# Patient Record
Sex: Male | Born: 2000 | Race: White | Hispanic: Yes | Marital: Single | Smoking: Never smoker
Health system: Southern US, Community
[De-identification: ages and names within clinical notes are randomized; demographics above are authoritative.]

## PROBLEM LIST (undated history)

## (undated) DIAGNOSIS — E669 Obesity, unspecified: Secondary | ICD-10-CM

## (undated) DIAGNOSIS — J301 Allergic rhinitis due to pollen: Secondary | ICD-10-CM

## (undated) DIAGNOSIS — J45909 Unspecified asthma, uncomplicated: Secondary | ICD-10-CM

## (undated) HISTORY — PX: APPENDECTOMY: SHX54

## (undated) HISTORY — PX: TONSILLECTOMY AND ADENOIDECTOMY: SHX28

## (undated) HISTORY — PX: HERNIA REPAIR: SHX51

---
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
## 2020-07-06 ENCOUNTER — Telehealth: Payer: Self-pay | Admitting: Emergency Medicine

## 2020-07-06 ENCOUNTER — Ambulatory Visit
Admission: EM | Admit: 2020-07-06 | Discharge: 2020-07-06 | Disposition: A | Discharge status: Home / Self Care | Payer: Medicaid Other | Attending: Internal Medicine | Admitting: Internal Medicine

## 2020-07-06 DIAGNOSIS — L0291 Cutaneous abscess, unspecified: Secondary | ICD-10-CM | POA: Diagnosis not present

## 2020-07-06 MED ORDER — DOXYCYCLINE MONOHYDRATE 100 MG PO TABS: 100.0000 mg | ORAL_TABLET | Freq: Two times a day (BID) | ORAL | 0 refills | Status: DC

## 2020-07-06 MED ORDER — CEPHALEXIN 500 MG PO CAPS: ORAL_CAPSULE | ORAL | 0 refills | Status: DC

## 2020-07-06 NOTE — Discharge Instructions (Addendum)
Use heat on the area for 15 minutes 2-3 times day for 3-4 days to help it resolve faster. Sometimes it makes the pimple or boil come to a head and puss may drain out. If so this will help it resolve sooner. Come back if this area gets larger or tender.   The doxy was cancelled since his insurance will not cover it and sent keflex instead.

## 2020-07-06 NOTE — Telephone Encounter (Signed)
Pharmacy called to state Doxycycline tablets are not covered under insurance but capsules are. Per [place], [place] script changed to capsules. Amy, CMA spoke with Lorin Picket at the pharmacy.

## 2020-07-06 NOTE — ED Provider Notes (Addendum)
[facility]    CSN: [id number] Arrival date & time: 07/06/20  1339      History   Chief Complaint Chief Complaint  Patient presents with  . Abscess    HPI Calvin Pacheco is a 19 y.o. male. who presents with swollen area on lower abdomen and states that he has had this area for a long time, but he squeezed it and it got larger. His mother looked at this and told him looked similar to what his sister had in the past and told to come here. This area is slightly tender. Denies drainage from this area. Has had an abscess in her R inner leg in the past, but has never been cultured or drained.    Past Medical History:  Diagnosis Date  . Allergic rhinitis due to pollen   . Asthma   . Obesity     There are no problems to display for this patient.   Past Surgical History:  Procedure Laterality Date  . APPENDECTOMY    . HERNIA REPAIR    . TONSILLECTOMY AND ADENOIDECTOMY         Home Medications    Prior to Admission medications   Medication Sig Start Date End Date Taking? Authorizing Provider  beclomethasone (QVAR) 40 MCG/ACT inhaler Inhale 1 puff into the lungs 2 (two) times daily.    [provider]  doxycycline (ADOXA) 100 MG tablet Take 1 tablet (100 mg total) by mouth 2 (two) times daily. 07/06/20   Rodriguez-Southworth, Nettie Elm, PA-C  fexofenadine (ALLEGRA) 180 MG tablet Take 180 mg by mouth daily.    [provider]  fluticasone (FLONASE) 50 MCG/ACT nasal spray Place 2 sprays into both nostrils daily. 12/06/18   Domenick Gong, MD    Family History Family History  Problem Relation Age of Onset  . Healthy Mother   . Heart disease Father     Social History Social History   Tobacco Use  . Smoking status: Never Smoker  . Smokeless tobacco: Never Used  Vaping Use  . Vaping Use: Never used  Substance Use Topics  . Alcohol use: No    Alcohol/week: 0.0 standard drinks  . Drug use: No     Allergies   Patient has no known  allergies.   Review of Systems Review of Systems  Constitutional: Negative for chills, diaphoresis and fever.  Gastrointestinal: Negative for abdominal distention, abdominal pain and nausea.  Skin:       Bump on lower abdomen  Neurological: Negative for numbness.     Physical Exam Triage Vital Signs ED Triage Vitals  Enc Vitals Group     BP 07/06/20 1350 (!) 130/101     Pulse Rate 07/06/20 1350 99     Resp 07/06/20 1350 18     Temp 07/06/20 1350 98.2 F (36.8 C)     Temp Source 07/06/20 1350 Oral     SpO2 07/06/20 1350 100 %     Weight 07/06/20 1351 (!) 330 lb (149.7 kg)     Height 07/06/20 1351 5\' 8"  (1.727 m)     Head Circumference --      Peak Flow --      Pain Score 07/06/20 1351 1     Pain Loc --      Pain Edu? --      Excl. in GC? --    No data found.  Updated Vital Signs BP (!) 130/101   Pulse 99   Temp 98.2 F (36.8 C) (  Oral)   Resp 18   Ht 5\' 8"  (1.727 m)   Wt (!) 330 lb (149.7 kg)   SpO2 100%   BMI 50.18 kg/m   Visual Acuity Right Eye Distance:   Left Eye Distance:   Bilateral Distance:    Right Eye Near:   Left Eye Near:    Bilateral Near:     Physical Exam Vitals and nursing note reviewed.  Constitutional:      General: He is not in acute distress.    Appearance: He is obese. He is not toxic-appearing.  HENT:     Right Ear: External ear normal.     Left Ear: External ear normal.     Nose: Nose normal.  Eyes:     General: No scleral icterus.    Conjunctiva/sclera: Conjunctivae normal.  Pulmonary:     Effort: Pulmonary effort is normal.  Musculoskeletal:        General: Normal range of motion.  Skin:    General: Skin is warm and dry.     Findings: Bruising present. No erythema.     Comments: 1 cm x 1 cm slightly raised bruised area which is a little fluctuant, but has induration under. Has squeezed it lightly and dark blood started draining, about 3 ml. But no puss. I placed a 2x2 gauze over it.   Neurological:     Mental Status:  He is alert and oriented to person, place, and time.  Psychiatric:        Mood and Affect: Mood normal.        Behavior: Behavior normal.        Thought Content: Thought content normal.        Judgment: Judgment normal.      UC Treatments / Results  Labs (all labs ordered are listed, but only abnormal results are displayed) Labs Reviewed - No data to display  EKG   Radiology No results found.  Procedures Procedures (including critical care time)  Medications Ordered in UC Medications - No data to display  Initial Impression / Assessment and Plan / UC Course  I have reviewed the triage vital signs and the nursing notes. I believe he may have early abscess with hematoma from squeezing it. I placed him on Doxy since he and sister have hx of abscess.  See instructions.  Final Clinical Impressions(s) / UC Diagnoses   Final diagnoses:  Abscess     Discharge Instructions     Use heat on the area for 15 minutes 2-3 times day for 3-4 days to help it resolve faster. Sometimes it makes the pimple or boil come to a head and puss may drain out. If so this will help it resolve sooner. Come back if this area gets larger or tender.     ED Prescriptions    Medication Sig Dispense Auth. Provider   doxycycline (ADOXA) 100 MG tablet Take 1 tablet (100 mg total) by mouth 2 (two) times daily. 20 tablet Rodriguez-Southworth, , PA-C     PDMP not reviewed this encounter.   Nettie Elm, PA-C 07/06/20 1424    Rodriguez-Southworth, 07/08/20, PA-C 07/06/20 1500

## 2020-07-06 NOTE — ED Triage Notes (Signed)
Pt reports having an abscess in groin area. Pt reports the area is red and swollen.

## 2020-11-07 ENCOUNTER — Ambulatory Visit
Admission: EM | Admit: 2020-11-07 | Discharge: 2020-11-07 | Disposition: A | Discharge status: Home / Self Care | Payer: Medicaid Other | Attending: Emergency Medicine | Admitting: Emergency Medicine

## 2020-11-07 ENCOUNTER — Other Ambulatory Visit: Payer: Self-pay

## 2020-11-07 DIAGNOSIS — J029 Acute pharyngitis, unspecified: Secondary | ICD-10-CM | POA: Insufficient documentation

## 2020-11-07 DIAGNOSIS — Z20822 Contact with and (suspected) exposure to covid-19: Secondary | ICD-10-CM | POA: Insufficient documentation

## 2020-11-07 LAB — GROUP A STREP BY PCR: Group A Strep by PCR: NOT DETECTED

## 2020-11-07 LAB — SARS CORONAVIRUS 2 (TAT 6-24 HRS): SARS Coronavirus 2: NEGATIVE

## 2020-11-07 MED ORDER — IBUPROFEN 600 MG PO TABS: 600.0000 mg | ORAL_TABLET | Freq: Four times a day (QID) | ORAL | 0 refills | Status: DC | PRN

## 2020-11-07 NOTE — Discharge Instructions (Addendum)
Your strep PCR test came back negative.  Covid test will be back in 24 hours.  We will contact you if it comes back positive and will arrange an appointment for monoclonal antibody infusion if you are a candidate for it.  I believe you are based on your history of asthma.   discontinue the Allegra, start Claritin-D or Zyrtec-D, and if this does not work, then discontinue the Claritin-D/ Zyrtec-D and start Mucinex D instead.  Continue Flonase, start saline nasal irrigation with a Lloyd Huger med rinse and distilled water as often as you want. 1 gram of Tylenol and 600 mg ibuprofen together 3-4 times a day as needed for pain.  Make sure you drink plenty of extra fluids.  Some people find salt water gargles and  Traditional Medicinal's "Throat Coat" tea helpful. Take 5 mL of liquid Benadryl and 5 mL of Maalox. Mix it together, and then hold it in your mouth for as long as you can and then swallow. You may do this 4 times a day.    Go to www.goodrx.com to look up your medications. This will give you a list of where you can find your prescriptions at the most affordable prices. Or ask the pharmacist what the cash price is, or if they have any other discount programs available to help make your medication more affordable. This can be less expensive than what you would pay with insurance.

## 2020-11-07 NOTE — ED Provider Notes (Signed)
HPI  SUBJECTIVE:  Patient reports sore throat starting 2 days ago. Sx worse with swallowing.  Sx better with nothing. Has been taking Allegra, Flonase w/ o relief.  No fever   No neck stiffness  No Cough + nasal congestion, greenish rhinorrhea, postnasal drip.  Positive maxillary sinus pain and pressure. No Myalgias No Headache No Rash  No loss of taste or smell No shortness of breath or difficulty breathing No nausea, vomiting No diarrhea No abdominal pain     No Recent Strep, mono, COVID exposure, however brother had similar symptoms last week.  It was not strep. No reflux sxs + Allergy sxs-reports itchy, watery eyes, sneezing.  Has a history of allergies and states that they are bothering him right now.  No Breathing difficulty, voice changes, sensation of throat swelling shut No Drooling No Trismus No abx in past month.  No antipyretic in past 4-6 hrs  Patient has a past medical history of allergies, allergic rhinitis, frequent strep status post tonsillectomy and adenoidectomy, asthma.  No history of diabetes, hypertension. ZJI:RCVELF, [facility]    Past Medical History:  Diagnosis Date  . Allergic rhinitis due to pollen   . Asthma   . Obesity     Past Surgical History:  Procedure Laterality Date  . APPENDECTOMY    . HERNIA REPAIR    . TONSILLECTOMY AND ADENOIDECTOMY      Family History  Problem Relation Age of Onset  . Healthy Mother   . Heart disease Father     Social History   Tobacco Use  . Smoking status: Never Smoker  . Smokeless tobacco: Never Used  Vaping Use  . Vaping Use: Never used  Substance Use Topics  . Alcohol use: No    Alcohol/week: 0.0 standard drinks  . Drug use: No    No current facility-administered medications for this encounter.  Current Outpatient Medications:  .  beclomethasone (QVAR) 40 MCG/ACT inhaler, Inhale 1 puff into the lungs 2 (two) times daily., Disp: , Rfl:  .  fluticasone (FLONASE) 50 MCG/ACT nasal  spray, Place 2 sprays into both nostrils daily., Disp: 16 g, Rfl: 0 .  ibuprofen (ADVIL) 600 MG tablet, Take 1 tablet (600 mg total) by mouth every 6 (six) hours as needed., Disp: 30 tablet, Rfl: 0  No Known Allergies   ROS  As noted in HPI.   Physical Exam  BP 119/69 (BP Location: Left Arm)   Pulse 84   Temp 98.2 F (36.8 C) (Oral)   Resp 18   Ht 5\' 8"  (1.727 m)   Wt (!) 154.2 kg   SpO2 98%   BMI 51.70 kg/m   Constitutional: Well developed, well nourished, no acute distress Eyes:  EOMI, conjunctiva normal bilaterally HENT: Normocephalic, atraumatic,mucus membranes moist. + nasal congestion, erythematous, swollen turbinates.  No maxillary, frontal sinus tenderness.  Positive erythematous oropharynx.  Tonsils surgically absent.  Uvula midline.  Positive cobblestoning and postnasal drip.    Respiratory: Normal inspiratory effort Cardiovascular: Normal rate, no murmurs, rubs, gallops GI: nondistended, nontender. No appreciable splenomegaly skin: No rash, skin intact Lymph: -  Anterior cervical LN.  No posterior cervical lymphadenopathy Musculoskeletal: no deformities Neurologic: Alert & oriented x 3, no focal neuro deficits Psychiatric: Speech and behavior appropriate.   ED Course   Medications - No data to display  Orders Placed This Encounter  Procedures  . Group A Strep by PCR    Standing Status:   Standing    Number of Occurrences:  1  . SARS CORONAVIRUS 2 (TAT 6-24 HRS) Nasopharyngeal Nasopharyngeal Swab    Standing Status:   Standing    Number of Occurrences:   1    Order Specific Question:   Is this test for diagnosis or screening    Answer:   Diagnosis of ill patient    Order Specific Question:   Symptomatic for COVID-19 as defined by CDC    Answer:   Yes    Order Specific Question:   Date of Symptom Onset    Answer:   11/05/2020    Order Specific Question:   Hospitalized for COVID-19    Answer:   No    Order Specific Question:   Admitted to ICU for  COVID-19    Answer:   No    Order Specific Question:   Previously tested for COVID-19    Answer:   Yes    Order Specific Question:   Resident in a congregate (group) care setting    Answer:   No    Order Specific Question:   Employed in healthcare setting    Answer:   No    Order Specific Question:   Has patient completed COVID vaccination(s) (2 doses of Pfizer/Moderna 1 dose of [employer])    Answer:   Yes    Results for orders placed or performed during the hospital encounter of 11/07/20 (from the past 24 hour(s))  Group A Strep by PCR     Status: None   Collection Time: 11/07/20  9:12 AM   Specimen: Throat; Sterile Swab  Result Value Ref Range   Group A Strep by PCR NOT DETECTED NOT DETECTED   No results found.  ED Clinical Impression  1. Pharyngitis, unspecified etiology   2. Encounter for laboratory testing for COVID-19 virus      ED Assessment/Plan  Covid PCR sent.  If positive, he will be a candidate for monoclonal antibody infusion based on BMI and asthma.  Difficult to tell if this is infectious or allergies.  His brother has similar symptoms, but it is not strep.    Strep PCR negative.  He states that his allergies are bothering him.  We will have him discontinue the Allegra, start Claritin-D or Zyrtec-D, and if this does not work, then he will discontinue the Claritin-D or Zyrtec-D and start Mucinex D instead.  Continue Flonase, start saline nasal irrigation with a Lloyd Huger med rinse and distilled water as often as he wants.  Benadryl/Maalox mixture, Tylenol/ibuprofen combined.  Follow-up with PMD as needed.  To the ER if he gets worse.  Discussed labs,  MDM, plan and followup with parent and patient.  Discussed sn/sx that should prompt return to the ED. parent and patient agrees with plan.   Meds ordered this encounter  Medications  . ibuprofen (ADVIL) 600 MG tablet    Sig: Take 1 tablet (600 mg total) by mouth every 6 (six) hours as needed.    Dispense:  30  tablet    Refill:  0     *This clinic note was created using Scientist, clinical (histocompatibility and immunogenetics). Therefore, there may be occasional mistakes despite careful proofreading.     Domenick Gong, MD 11/07/20 1024

## 2020-11-07 NOTE — ED Triage Notes (Signed)
Patient complains of sore throat, runny nose and cough x Sunday.

## 2021-01-10 ENCOUNTER — Ambulatory Visit
Admission: EM | Admit: 2021-01-10 | Discharge: 2021-01-10 | Disposition: A | Discharge status: Home / Self Care | Payer: Medicaid Other | Attending: Sports Medicine | Admitting: Sports Medicine

## 2021-01-10 ENCOUNTER — Other Ambulatory Visit: Payer: Self-pay

## 2021-01-10 DIAGNOSIS — J069 Acute upper respiratory infection, unspecified: Secondary | ICD-10-CM

## 2021-01-10 DIAGNOSIS — M791 Myalgia, unspecified site: Secondary | ICD-10-CM | POA: Diagnosis not present

## 2021-01-10 DIAGNOSIS — U071 COVID-19: Secondary | ICD-10-CM | POA: Diagnosis not present

## 2021-01-10 DIAGNOSIS — J3489 Other specified disorders of nose and nasal sinuses: Secondary | ICD-10-CM | POA: Diagnosis not present

## 2021-01-10 DIAGNOSIS — Z79899 Other long term (current) drug therapy: Secondary | ICD-10-CM | POA: Diagnosis not present

## 2021-01-10 DIAGNOSIS — J029 Acute pharyngitis, unspecified: Secondary | ICD-10-CM | POA: Diagnosis not present

## 2021-01-10 DIAGNOSIS — Z7951 Long term (current) use of inhaled steroids: Secondary | ICD-10-CM | POA: Diagnosis not present

## 2021-01-10 DIAGNOSIS — R059 Cough, unspecified: Secondary | ICD-10-CM | POA: Insufficient documentation

## 2021-01-10 LAB — SARS CORONAVIRUS 2 (TAT 6-24 HRS): SARS Coronavirus 2: POSITIVE — AB

## 2021-01-10 NOTE — ED Triage Notes (Signed)
Patient complains of cough, back pain, headaches, nasal congestion and runny nose x Monday. States that he has not had any covid testing.

## 2021-01-10 NOTE — Discharge Instructions (Signed)
We will go ahead and tested for COVID.  Indicated that it would take between 6 and 24 hours for the test result to come back.  He voiced verbal understanding. We will give him a work note indicating he is in isolation until the results of the COVID test come back.  If he is negative then he can return to work with a mask.  If he is positive then his isolation will switch to quarantine per the new CDC guidelines.  Somebody from the office will contact him for a positive test only. Supportive care, over-the-counter meds as needed, Tylenol or Motrin for fever or discomfort.  Symptomatic control including cough medicine, Mucinex, and salt water gargles. We will give him an educational handout. Follow-up here as needed.

## 2021-01-10 NOTE — ED Provider Notes (Signed)
[facility]    CSN: [id number] Arrival date & time: 01/10/21  0806      History   Chief Complaint Chief Complaint  Patient presents with  . Cough    HPI Calvin Pacheco is a 20 y.o. male.   Pleasant 20 year old male who presents for evaluation the above issues.  He reports 2 days of cough rhinorrhea and a mild sore throat.  The cough is nonproductive.  He denies any fever shakes chills, nausea vomiting or diarrhea.  No abdominal pain or urinary symptoms.  Denies chest pain or shortness of breath.  He reports no COVID exposure and no history of COVID.  Has been vaccinated x2 but no booster.  No flu vaccine.  He also reports some mild myalgias with some low back issues.  No accidents trauma or falls with his back pain.  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  No red flag signs or symptoms offered by the patient during the history.     Past Medical History:  Diagnosis Date  . Allergic rhinitis due to pollen   . Asthma   . Obesity     There are no problems to display for this patient.   Past Surgical History:  Procedure Laterality Date  . APPENDECTOMY    . HERNIA REPAIR    . TONSILLECTOMY AND ADENOIDECTOMY         Home Medications    Prior to Admission medications   Medication Sig Start Date End Date Taking? Authorizing Provider  albuterol (VENTOLIN HFA) 108 (90 Base) MCG/ACT inhaler Inhale into the lungs. 03/07/20 03/07/21 Yes [provider]  beclomethasone (QVAR) 40 MCG/ACT inhaler Inhale 1 puff into the lungs 2 (two) times daily.   Yes [provider]  fluticasone (FLONASE) 50 MCG/ACT nasal spray Place 2 sprays into both nostrils daily. 12/06/18  Yes Domenick Gong, MD  ibuprofen (ADVIL) 600 MG tablet Take 1 tablet (600 mg total) by mouth every 6 (six) hours as needed. 11/07/20  Yes Domenick Gong, MD  SODIUM FLUORIDE 5000 PPM 1.1 % PSTE Take by mouth. 10/12/20   [provider]  fexofenadine (ALLEGRA) 180 MG  tablet Take 180 mg by mouth daily.  11/07/20  [provider]    Family History Family History  Problem Relation Age of Onset  . Healthy Mother   . Heart disease Father     Social History Social History   Tobacco Use  . Smoking status: Never Smoker  . Smokeless tobacco: Never Used  Vaping Use  . Vaping Use: Never used  Substance Use Topics  . Alcohol use: No    Alcohol/week: 0.0 standard drinks  . Drug use: No     Allergies   Patient has no known allergies.   Review of Systems Review of Systems  Constitutional: Negative for chills, diaphoresis, fatigue and fever.  HENT: Positive for congestion, rhinorrhea and sore throat. Negative for ear pain, postnasal drip, sinus pressure, sinus pain and sneezing.   Eyes: Negative for pain.  Respiratory: Negative for cough, choking, chest tightness, shortness of breath, wheezing and stridor.   Cardiovascular: Negative for chest pain and palpitations.  Gastrointestinal: Negative for abdominal pain.  Genitourinary: Negative for dysuria.  Musculoskeletal: Positive for back pain and myalgias. Negative for neck pain and neck stiffness.  Skin: Negative for color change, pallor, rash and wound.  Neurological: Negative for dizziness, syncope, light-headedness, numbness and headaches.  All other systems reviewed and are negative.    Physical Exam  Triage Vital Signs ED Triage Vitals  Enc Vitals Group     BP 01/10/21 0827 118/77     Pulse Rate 01/10/21 0827 98     Resp 01/10/21 0827 18     Temp 01/10/21 0827 98.6 F (37 C)     Temp Source 01/10/21 0827 Oral     SpO2 01/10/21 0827 99 %     Weight 01/10/21 0825 (!) 339 lb 15.2 oz (154.2 kg)     Height 01/10/21 0825 5\' 8"  (1.727 m)     Head Circumference --      Peak Flow --      Pain Score 01/10/21 0825 2     Pain Loc --      Pain Edu? --      Excl. in GC? --    No data found.  Updated Vital Signs BP 118/77 (BP Location: Left Arm)   Pulse 98   Temp 98.6 F (37 C)  (Oral)   Resp 18   Ht 5\' 8"  (1.727 m)   Wt (!) 154.2 kg   SpO2 99%   BMI 51.69 kg/m   Visual Acuity Right Eye Distance:   Left Eye Distance:   Bilateral Distance:    Right Eye Near:   Left Eye Near:    Bilateral Near:     Physical Exam Vitals and nursing note reviewed.  Constitutional:      General: He is not in acute distress.    Appearance: Normal appearance. He is not ill-appearing, toxic-appearing or diaphoretic.  HENT:     Head: Normocephalic and atraumatic.     Right Ear: Tympanic membrane normal.     Left Ear: Tympanic membrane normal.     Nose: Congestion and rhinorrhea present.     Mouth/Throat:     Mouth: Mucous membranes are moist.     Pharynx: Posterior oropharyngeal erythema present. No oropharyngeal exudate.  Eyes:     Extraocular Movements: Extraocular movements intact.     Conjunctiva/sclera: Conjunctivae normal.     Pupils: Pupils are equal, round, and reactive to light.  Cardiovascular:     Rate and Rhythm: Normal rate and regular rhythm.     Pulses: Normal pulses.     Heart sounds: Normal heart sounds. No murmur heard. No friction rub. No gallop.   Pulmonary:     Effort: Pulmonary effort is normal. No respiratory distress.     Breath sounds: Normal breath sounds. No stridor. No wheezing, rhonchi or rales.  Musculoskeletal:     Cervical back: Normal range of motion and neck supple. No rigidity.  Lymphadenopathy:     Cervical: Cervical adenopathy present.  Skin:    General: Skin is warm and dry.     Capillary Refill: Capillary refill takes less than 2 seconds.  Neurological:     General: No focal deficit present.     Mental Status: He is alert and oriented to person, place, and time. Mental status is at baseline.  Psychiatric:        Mood and Affect: Mood normal.        Behavior: Behavior normal.      UC Treatments / Results  Labs (all labs ordered are listed, but only abnormal results are displayed) Labs Reviewed  SARS CORONAVIRUS 2  (TAT 6-24 HRS)    EKG   Radiology No results found.  Procedures Procedures (including critical care time)  Medications Ordered in UC Medications - No data to display  Initial Impression / Assessment and Plan /  UC Course  I have reviewed the triage vital signs and the nursing notes.  Pertinent labs & imaging results that were available during my care of the patient were reviewed by me and considered in my medical decision making (see chart for details).   Clinical impression: 20 year old with COVID-like symptoms including cough rhinorrhea and sore throat with some myalgias for 2 days.  Treatment plan: 1.  The findings and treatment plan were discussed in detail with the patient.  The patient was in agreement. 2.  We will go ahead and tested for COVID.  Indicated that it would take between 6 and 24 hours for the test result to come back.  He voiced verbal understanding. 3.  We will give him a work note indicating he is in isolation until the results of the COVID test come back.  If he is negative then he can return to work with a mask.  If he is positive then his isolation will switch to quarantine per the new CDC guidelines.  Somebody from the office will contact him for a positive test only. 4.  Supportive care, over-the-counter meds as needed, Tylenol or Motrin for fever or discomfort.  Symptomatic control including cough medicine, Mucinex, and salt water gargles. 5.  We will give him an educational handout. 6.  Follow-up here as needed.    Final Clinical Impressions(s) / UC Diagnoses   Final diagnoses:  Cough  Upper respiratory tract infection, unspecified type  Myalgia     Discharge Instructions     We will go ahead and tested for COVID.  Indicated that it would take between 6 and 24 hours for the test result to come back.  He voiced verbal understanding. We will give him a work note indicating he is in isolation until the results of the COVID test come back.  If he is  negative then he can return to work with a mask.  If he is positive then his isolation will switch to quarantine per the new CDC guidelines.  Somebody from the office will contact him for a positive test only. Supportive care, over-the-counter meds as needed, Tylenol or Motrin for fever or discomfort.  Symptomatic control including cough medicine, Mucinex, and salt water gargles. We will give him an educational handout. Follow-up here as needed.    ED Prescriptions    None     PDMP not reviewed this encounter.   Delton See, MD 01/10/21 [phone]

## 2021-01-14 ENCOUNTER — Ambulatory Visit
Admission: EM | Admit: 2021-01-14 | Discharge: 2021-01-14 | Disposition: A | Discharge status: Home / Self Care | Payer: Medicaid Other | Attending: Physician Assistant | Admitting: Physician Assistant

## 2021-01-14 ENCOUNTER — Other Ambulatory Visit: Payer: Self-pay

## 2021-01-14 ENCOUNTER — Encounter: Payer: Self-pay | Admitting: Emergency Medicine

## 2021-01-14 DIAGNOSIS — U071 COVID-19: Secondary | ICD-10-CM | POA: Diagnosis not present

## 2021-01-14 DIAGNOSIS — R059 Cough, unspecified: Secondary | ICD-10-CM

## 2021-01-14 DIAGNOSIS — B309 Viral conjunctivitis, unspecified: Secondary | ICD-10-CM | POA: Diagnosis not present

## 2021-01-14 MED ORDER — NAPHAZOLINE-POLYETHYL GLYCOL 0.012-0.2 % OP SOLN: 2.0000 [drp] | Freq: Four times a day (QID) | OPHTHALMIC | 0 refills | Status: AC | PRN

## 2021-01-14 MED ORDER — IBUPROFEN 800 MG PO TABS: 800.0000 mg | ORAL_TABLET | Freq: Three times a day (TID) | ORAL | 0 refills | Status: AC | PRN

## 2021-01-14 MED ORDER — BENZONATATE 100 MG PO CAPS: 200.0000 mg | ORAL_CAPSULE | Freq: Three times a day (TID) | ORAL | 0 refills | Status: AC | PRN

## 2021-01-14 NOTE — Discharge Instructions (Addendum)
You have a viral conjunctivitis due to COVID-19.  I have sent in some eyedrops to help.  If you have thick yellowish discharge he should follow-up with you should get better over the next few days.  I have also sent benzonatate for cough and Advil for pain.  Increase rest and fluids.  Continue to use your asthma inhalers.  You should be seen again if you develop a fever or breathing difficulty.  COVID-19 INFECTION: The incubation period of COVID-19 is approximately 14 days after exposure, with most symptoms developing in roughly 4-5 days. Symptoms may range in severity from mild to critically severe. Roughly 80% of those infected will have mild symptoms. People of any age may become infected with COVID-19 and have the ability to transmit the virus. The most common symptoms include: fever, fatigue, cough, body aches, headaches, sore throat, nasal congestion, shortness of breath, nausea, vomiting, diarrhea, changes in smell and/or taste.    COURSE OF ILLNESS Some patients may begin with mild disease which can progress quickly into critical symptoms. If your symptoms are worsening please call ahead to the Emergency Department and proceed there for further treatment. Recovery time appears to be roughly 1-2 weeks for mild symptoms and 3-6 weeks for severe disease.   GO IMMEDIATELY TO ER FOR FEVER YOU ARE UNABLE TO GET DOWN WITH TYLENOL, BREATHING PROBLEMS, CHEST PAIN, FATIGUE, LETHARGY, INABILITY TO EAT OR DRINK, ETC  QUARANTINE AND ISOLATION: To help decrease the spread of COVID-19 please remain isolated if you have COVID infection or are highly suspected to have COVID infection. This means -stay home and isolate to one room in the home if you live with others. Do not share a bed or bathroom with others while ill, sanitize and wipe down all countertops and keep common areas clean and disinfected. Stay home for 5 days. If you have no symptoms or your symptoms are resolving after 5 days, you can leave your  house. Continue to wear a mask around others for 5 additional days. If you have been in close contact (within 6 feet) of someone diagnosed with COVID 19, you are advised to quarantine in your home for 14 days as symptoms can develop anywhere from 2-14 days after exposure to the virus. If you develop symptoms, you  must isolate.  Most current guidelines for COVID after exposure -unvaccinated: isolate 5 days and strict mask use x 5 days. Test on day 5 is possible -vaccinated: wear mask x 10 days if symptoms do not develop -You do not necessarily need to be tested for COVID if you have + exposure and  develop symptoms. Just isolate at home x10 days from symptom onset During this global pandemic, CDC advises to practice social distancing, try to stay at least 15ft away from others at all times. Wear a face covering. Wash and sanitize your hands regularly and avoid going anywhere that is not necessary.  KEEP IN MIND THAT THE COVID TEST IS NOT 100% ACCURATE AND YOU SHOULD STILL DO EVERYTHING TO PREVENT POTENTIAL SPREAD OF VIRUS TO OTHERS (WEAR MASK, WEAR GLOVES, WASH HANDS AND SANITIZE REGULARLY). IF INITIAL TEST IS NEGATIVE, THIS MAY NOT MEAN YOU ARE DEFINITELY NEGATIVE. MOST ACCURATE TESTING IS DONE 5-7 DAYS AFTER EXPOSURE.   It is not advised by CDC to get re-tested after receiving a positive COVID test since you can still test positive for weeks to months after you have already cleared the virus.   *If you have not been vaccinated for COVID, I strongly  suggest you consider getting vaccinated as long as there are no contraindications.

## 2021-01-14 NOTE — ED Provider Notes (Signed)
[facility]    CSN: [id number] Arrival date & time: 01/14/21  0801      History   Chief Complaint Chief Complaint  Patient presents with  . Covid Positive  . Eye Pain    HPI Calvin Pacheco is a 20 y.o. male with recent diagnosis of COVID 7.  Patient has been ill for 6 days with cough, congestion, headaches.  Patient states over the past couple days his eyes have become red, itchy and draining clear fluid.  Denies any visual changes.  Patient denies any fever, weakness, breathing difficulty.  He says that his chest is hurt when he coughs.  He does have a history of asthma and has been using his inhalers as directed.  Patient has not been taking any over-the-counter medications for his symptoms.  He has multiple family members who are also COVID-positive.  Other than asthma and allergies, patient denies any other chronic medical problems.  He has no other complaints or concerns.  HPI  Past Medical History:  Diagnosis Date  . Allergic rhinitis due to pollen   . Asthma   . Obesity     There are no problems to display for this patient.   Past Surgical History:  Procedure Laterality Date  . APPENDECTOMY    . HERNIA REPAIR    . TONSILLECTOMY AND ADENOIDECTOMY         Home Medications    Prior to Admission medications   Medication Sig Start Date End Date Taking? Authorizing Provider  albuterol (VENTOLIN HFA) 108 (90 Base) MCG/ACT inhaler Inhale into the lungs. 03/07/20 03/07/21 Yes [provider]  beclomethasone (QVAR) 40 MCG/ACT inhaler Inhale 1 puff into the lungs 2 (two) times daily.   Yes [provider]  benzonatate (TESSALON) 100 MG capsule Take 2 capsules (200 mg total) by mouth 3 (three) times daily as needed for up to 7 days for cough. 01/14/21 01/21/21 Yes Eusebio Friendly B, PA-C  fluticasone (FLONASE) 50 MCG/ACT nasal spray Place 2 sprays into both nostrils daily. 12/06/18  Yes Domenick Gong, MD  ibuprofen (ADVIL) 800 MG tablet  Take 1 tablet (800 mg total) by mouth every 8 (eight) hours as needed for up to 7 days for moderate pain. 01/14/21 01/21/21 Yes Shirlee Latch, PA-C  Naphazoline-Polyethyl Glycol 0.012-0.2 % SOLN Apply 2 drops to eye every 6 (six) hours as needed for up to 7 days (eye pain, redness, itching). 01/14/21 01/21/21 Yes Shirlee Latch, PA-C  SODIUM FLUORIDE 5000 PPM 1.1 % PSTE Take by mouth. 10/12/20   [provider]  fexofenadine (ALLEGRA) 180 MG tablet Take 180 mg by mouth daily.  11/07/20  [provider]    Family History Family History  Problem Relation Age of Onset  . Healthy Mother   . Heart disease Father     Social History Social History   Tobacco Use  . Smoking status: Never Smoker  . Smokeless tobacco: Never Used  Vaping Use  . Vaping Use: Never used  Substance Use Topics  . Alcohol use: No    Alcohol/week: 0.0 standard drinks  . Drug use: No     Allergies   Patient has no known allergies.   Review of Systems Review of Systems  Constitutional: Positive for fatigue. Negative for fever.  HENT: Positive for congestion and rhinorrhea. Negative for sinus pressure, sinus pain and sore throat.   Eyes: Positive for pain, discharge, redness and itching.  Respiratory: Positive for cough. Negative for shortness of breath  and wheezing.   Cardiovascular: Positive for chest pain (only with deep coughing).  Gastrointestinal: Negative for abdominal pain, diarrhea, nausea and vomiting.  Musculoskeletal: Negative for myalgias.  Neurological: Positive for headaches. Negative for weakness and light-headedness.  Hematological: Negative for adenopathy.     Physical Exam Triage Vital Signs ED Triage Vitals  Enc Vitals Group     BP      Pulse      Resp      Temp      Temp src      SpO2      Weight      Height      Head Circumference      Peak Flow      Pain Score      Pain Loc      Pain Edu?      Excl. in GC?    No data found.  Updated Vital Signs BP  126/80 (BP Location: Left Arm)   Pulse 86   Temp 98.6 F (37 C) (Oral)   Resp 18   Wt (!) 321 lb 6.4 oz (145.8 kg)   SpO2 100%   BMI 48.87 kg/m       Physical Exam Vitals and nursing note reviewed.  Constitutional:      General: He is not in acute distress.    Appearance: Normal appearance. He is well-developed and well-nourished. He is obese. He is not ill-appearing or toxic-appearing.  HENT:     Head: Normocephalic and atraumatic.     Nose: Congestion and rhinorrhea present.     Mouth/Throat:     Mouth: Mucous membranes are moist.     Pharynx: Oropharynx is clear. No posterior oropharyngeal erythema.  Eyes:     General: No scleral icterus.       Right eye: No discharge.        Left eye: No discharge.     Extraocular Movements: Extraocular movements intact.     Conjunctiva/sclera:     Right eye: Right conjunctiva is injected (mild).     Left eye: Left conjunctiva is injected (mild).     Pupils: Pupils are equal, round, and reactive to light.  Cardiovascular:     Rate and Rhythm: Normal rate and regular rhythm.     Heart sounds: Normal heart sounds.  Pulmonary:     Effort: Pulmonary effort is normal. No respiratory distress.     Breath sounds: Normal breath sounds. No wheezing, rhonchi or rales.  Musculoskeletal:        General: No edema.     Cervical back: Neck supple.  Skin:    General: Skin is warm and dry.  Neurological:     General: No focal deficit present.     Mental Status: He is alert. Mental status is at baseline.     Motor: No weakness.     Gait: Gait normal.  Psychiatric:        Mood and Affect: Mood and affect and mood normal.        Behavior: Behavior normal.        Thought Content: Thought content normal.      UC Treatments / Results  Labs (all labs ordered are listed, but only abnormal results are displayed) Labs Reviewed - No data to display  EKG   Radiology No results found.  Procedures Procedures (including critical care  time)  Medications Ordered in UC Medications - No data to display  Initial Impression / Assessment and Plan / UC  Course  I have reviewed the triage vital signs and the nursing notes.  Pertinent labs & imaging results that were available during my care of the patient were reviewed by me and considered in my medical decision making (see chart for details).   1. COVID 19: Advised patient that his symptoms are due to COVID-19 and he should stay home until he is feeling better.  Discussed current CDC guidelines, isolation protocol and ED precautions.  2. Viral conjunctivitis of both eyes: Advised patient that the redness of his eyes is due to viral conjunctivitis secondary to COVID-19.  Sent in naphazoline eyedrops.  Encouraged him to rest and increase fluid intake.  Advised him to follow-up if he has any increased eye pain, visual disturbance, or thick yellowish discharge from his eyes.  3. Cough: Advised supportive care.  Patient has not been taking any over-the-counter cough medications.  Sent in benzonatate since that is the only cough medications covered by Medicaid.  Advised that if that is not helping he should consider OTC Robitussin.  Advised him to follow-up for worsening cough, increased pain in the chest, fever or breathing difficulty.  Advised that his symptoms can be ongoing for a couple of weeks but should be improving.  Final Clinical Impressions(s) / UC Diagnoses   Final diagnoses:  COVID-19  Viral conjunctivitis of both eyes  Cough     Discharge Instructions     You have a viral conjunctivitis due to COVID-19.  I have sent in some eyedrops to help.  If you have thick yellowish discharge he should follow-up with you should get better over the next few days.  I have also sent benzonatate for cough and Advil for pain.  Increase rest and fluids.  Continue to use your asthma inhalers.  You should be seen again if you develop a fever or breathing difficulty.  COVID-19 INFECTION:  The incubation period of COVID-19 is approximately 14 days after exposure, with most symptoms developing in roughly 4-5 days. Symptoms may range in severity from mild to critically severe. Roughly 80% of those infected will have mild symptoms. People of any age may become infected with COVID-19 and have the ability to transmit the virus. The most common symptoms include: fever, fatigue, cough, body aches, headaches, sore throat, nasal congestion, shortness of breath, nausea, vomiting, diarrhea, changes in smell and/or taste.    COURSE OF ILLNESS Some patients may begin with mild disease which can progress quickly into critical symptoms. If your symptoms are worsening please call ahead to the Emergency Department and proceed there for further treatment. Recovery time appears to be roughly 1-2 weeks for mild symptoms and 3-6 weeks for severe disease.   GO IMMEDIATELY TO ER FOR FEVER YOU ARE UNABLE TO GET DOWN WITH TYLENOL, BREATHING PROBLEMS, CHEST PAIN, FATIGUE, LETHARGY, INABILITY TO EAT OR DRINK, ETC  QUARANTINE AND ISOLATION: To help decrease the spread of COVID-19 please remain isolated if you have COVID infection or are highly suspected to have COVID infection. This means -stay home and isolate to one room in the home if you live with others. Do not share a bed or bathroom with others while ill, sanitize and wipe down all countertops and keep common areas clean and disinfected. Stay home for 5 days. If you have no symptoms or your symptoms are resolving after 5 days, you can leave your house. Continue to wear a mask around others for 5 additional days. If you have been in close contact (within 6 feet) of someone  diagnosed with COVID 19, you are advised to quarantine in your home for 14 days as symptoms can develop anywhere from 2-14 days after exposure to the virus. If you develop symptoms, you  must isolate.  Most current guidelines for COVID after exposure -unvaccinated: isolate 5 days and strict  mask use x 5 days. Test on day 5 is possible -vaccinated: wear mask x 10 days if symptoms do not develop -You do not necessarily need to be tested for COVID if you have + exposure and  develop symptoms. Just isolate at home x10 days from symptom onset During this global pandemic, CDC advises to practice social distancing, try to stay at least 90ft away from others at all times. Wear a face covering. Wash and sanitize your hands regularly and avoid going anywhere that is not necessary.  KEEP IN MIND THAT THE COVID TEST IS NOT 100% ACCURATE AND YOU SHOULD STILL DO EVERYTHING TO PREVENT POTENTIAL SPREAD OF VIRUS TO OTHERS (WEAR MASK, WEAR GLOVES, WASH HANDS AND SANITIZE REGULARLY). IF INITIAL TEST IS NEGATIVE, THIS MAY NOT MEAN YOU ARE DEFINITELY NEGATIVE. MOST ACCURATE TESTING IS DONE 5-7 DAYS AFTER EXPOSURE.   It is not advised by CDC to get re-tested after receiving a positive COVID test since you can still test positive for weeks to months after you have already cleared the virus.   *If you have not been vaccinated for COVID, I strongly suggest you consider getting vaccinated as long as there are no contraindications.      ED Prescriptions    Medication Sig Dispense Auth. Provider   benzonatate (TESSALON) 100 MG capsule Take 2 capsules (200 mg total) by mouth 3 (three) times daily as needed for up to 7 days for cough. 21 capsule Eusebio Friendly B, PA-C   Naphazoline-Polyethyl Glycol 0.012-0.2 % SOLN Apply 2 drops to eye every 6 (six) hours as needed for up to 7 days (eye pain, redness, itching). 15 mL Eusebio Friendly B, PA-C   ibuprofen (ADVIL) 800 MG tablet Take 1 tablet (800 mg total) by mouth every 8 (eight) hours as needed for up to 7 days for moderate pain. 21 tablet Shirlee Latch, PA-C     PDMP not reviewed this encounter.   Shirlee Latch, PA-C 01/14/21 [phone]

## 2021-01-14 NOTE — ED Triage Notes (Signed)
Pt tested positive  4 days ago for Covid. He is c/o eye redness and itching. Started yesterday. He states his eye is causing a headache. He also states his chest hurt when he coughs. Pt has h/o asthma.

## 2021-01-18 ENCOUNTER — Other Ambulatory Visit: Payer: Self-pay

## 2021-01-18 ENCOUNTER — Ambulatory Visit
Admission: EM | Admit: 2021-01-18 | Discharge: 2021-01-18 | Discharge status: Against Medical Advice | Payer: Medicaid Other

## 2021-05-02 ENCOUNTER — Other Ambulatory Visit: Payer: Self-pay

## 2021-05-02 ENCOUNTER — Ambulatory Visit
Admission: EM | Admit: 2021-05-02 | Discharge: 2021-05-02 | Disposition: A | Discharge status: Home / Self Care | Payer: Medicaid Other | Attending: Sports Medicine | Admitting: Sports Medicine

## 2021-05-02 ENCOUNTER — Encounter: Payer: Self-pay | Admitting: Emergency Medicine

## 2021-05-02 DIAGNOSIS — J4520 Mild intermittent asthma, uncomplicated: Secondary | ICD-10-CM | POA: Insufficient documentation

## 2021-05-02 DIAGNOSIS — Z8616 Personal history of COVID-19: Secondary | ICD-10-CM | POA: Insufficient documentation

## 2021-05-02 DIAGNOSIS — J069 Acute upper respiratory infection, unspecified: Secondary | ICD-10-CM | POA: Diagnosis not present

## 2021-05-02 DIAGNOSIS — R059 Cough, unspecified: Secondary | ICD-10-CM

## 2021-05-02 DIAGNOSIS — Z7951 Long term (current) use of inhaled steroids: Secondary | ICD-10-CM | POA: Insufficient documentation

## 2021-05-02 DIAGNOSIS — Z20822 Contact with and (suspected) exposure to covid-19: Secondary | ICD-10-CM | POA: Insufficient documentation

## 2021-05-02 DIAGNOSIS — R062 Wheezing: Secondary | ICD-10-CM | POA: Diagnosis not present

## 2021-05-02 MED ORDER — PROMETHAZINE-DM 6.25-15 MG/5ML PO SYRP: 5.0000 mL | ORAL_SOLUTION | Freq: Four times a day (QID) | ORAL | 0 refills | Status: DC | PRN

## 2021-05-02 MED ORDER — BENZONATATE 100 MG PO CAPS: 100.0000 mg | ORAL_CAPSULE | Freq: Three times a day (TID) | ORAL | 0 refills | Status: DC

## 2021-05-02 MED ORDER — ALBUTEROL SULFATE HFA 108 (90 BASE) MCG/ACT IN AERS: 1.0000 | INHALATION_SPRAY | Freq: Four times a day (QID) | RESPIRATORY_TRACT | 0 refills | Status: DC | PRN

## 2021-05-02 NOTE — ED Provider Notes (Signed)
[facility]    CSN: [id number] Arrival date & time: 05/02/21  1909      History   Chief Complaint Chief Complaint  Patient presents with  . Cough  . Nasal Congestion    HPI Calvin Pacheco is a 20 y.o. male.   Patient is a pleasant 20 year old male who presents for evaluation of the above issue.  He normally sees [facility] for his ongoing medical care but could not be seen today.  He works as a Glass blower/designer.  He has not missed any work due to his symptoms.  He reports cough and wheezing x4 days.  He denies any fever shakes chills.  He does have a history of allergies and takes Equities trader.  He also takes a steroid inhaler due to his history of asthma as well.  He did have COVID back in January 2022 and completely resolved from that.  He has been vaccinated against COVID and has received his booster.  He is also received his flu shot.  No recent COVID exposure.  He denies any nausea vomiting or diarrhea.  No chest pain or shortness of breath.  No wheezing or difficulty breathing.  No abdominal pain or urinary symptoms.  Given her symptoms are not proving and comes in today for initial urgent care evaluation.  No red flag signs or symptoms are elicited on history.      Past Medical History:  Diagnosis Date  . Allergic rhinitis due to pollen   . Asthma   . Obesity     There are no problems to display for this patient.   Past Surgical History:  Procedure Laterality Date  . APPENDECTOMY    . HERNIA REPAIR    . TONSILLECTOMY AND ADENOIDECTOMY         Home Medications    Prior to Admission medications   Medication Sig Start Date End Date Taking? Authorizing Provider  albuterol (VENTOLIN HFA) 108 (90 Base) MCG/ACT inhaler Inhale 1-2 puffs into the lungs every 6 (six) hours as needed for wheezing or shortness of breath. 05/02/21  Yes Delton See, MD  benzonatate (TESSALON) 100 MG capsule Take 1 capsule (100 mg total) by mouth every 8 (eight)  hours. 05/02/21  Yes Delton See, MD  promethazine-dextromethorphan (PROMETHAZINE-DM) 6.25-15 MG/5ML syrup Take 5 mLs by mouth 4 (four) times daily as needed for cough. 05/02/21  Yes Delton See, MD  beclomethasone (QVAR) 40 MCG/ACT inhaler Inhale 1 puff into the lungs 2 (two) times daily.    [provider]  fluticasone (FLONASE) 50 MCG/ACT nasal spray Place 2 sprays into both nostrils daily. 12/06/18   Domenick Gong, MD  SODIUM FLUORIDE 5000 PPM 1.1 % PSTE Take by mouth. 10/12/20   [provider]  fexofenadine (ALLEGRA) 180 MG tablet Take 180 mg by mouth daily.  11/07/20  [provider]    Family History Family History  Problem Relation Age of Onset  . Healthy Mother   . Heart disease Father     Social History Social History   Tobacco Use  . Smoking status: Never Smoker  . Smokeless tobacco: Never Used  Vaping Use  . Vaping Use: Never used  Substance Use Topics  . Alcohol use: No    Alcohol/week: 0.0 standard drinks  . Drug use: No     Allergies   Patient has no known allergies.   Review of Systems Review of Systems  Constitutional: Negative.  Negative for activity change, appetite change, chills, diaphoresis and  fever.  HENT: Negative.  Negative for congestion, ear pain, rhinorrhea, sinus pressure, sinus pain, sneezing and sore throat.   Eyes: Negative.  Negative for pain.  Respiratory: Positive for cough, chest tightness and wheezing. Negative for choking, shortness of breath and stridor.   Cardiovascular: Negative.  Negative for chest pain, palpitations and leg swelling.  Gastrointestinal: Negative.  Negative for abdominal pain, constipation, diarrhea, nausea and vomiting.  Genitourinary: Negative.  Negative for dysuria.  Musculoskeletal: Negative.  Negative for arthralgias, back pain, myalgias, neck pain and neck stiffness.  Skin: Negative.  Negative for color change, pallor, rash and wound.  Neurological: Negative.  Negative  for dizziness, seizures, weakness, light-headedness and headaches.  All other systems reviewed and are negative.    Physical Exam Triage Vital Signs ED Triage Vitals  Enc Vitals Group     BP 05/02/21 1938 110/70     Pulse Rate 05/02/21 1938 80     Resp 05/02/21 1938 18     Temp 05/02/21 1938 98.7 F (37.1 C)     Temp Source 05/02/21 1938 Oral     SpO2 05/02/21 1938 98 %     Weight 05/02/21 1936 (!) 310 lb (140.6 kg)     Height 05/02/21 1936 5\' 8"  (1.727 m)     Head Circumference --      Peak Flow --      Pain Score 05/02/21 1936 0     Pain Loc --      Pain Edu? --      Excl. in GC? --    No data found.  Updated Vital Signs BP 110/70 (BP Location: Right Arm)   Pulse 80   Temp 98.7 F (37.1 C) (Oral)   Resp 18   Ht 5\' 8"  (1.727 m)   Wt (!) 140.6 kg   SpO2 98%   BMI 47.14 kg/m   Visual Acuity Right Eye Distance:   Left Eye Distance:   Bilateral Distance:    Right Eye Near:   Left Eye Near:    Bilateral Near:     Physical Exam Vitals and nursing note reviewed.  Constitutional:      General: He is not in acute distress.    Appearance: Normal appearance. He is obese. He is not ill-appearing, toxic-appearing or diaphoretic.  HENT:     Head: Normocephalic and atraumatic.     Right Ear: Tympanic membrane normal.     Left Ear: Tympanic membrane normal.     Nose: No congestion or rhinorrhea.     Mouth/Throat:     Mouth: Mucous membranes are moist.  Eyes:     General: No scleral icterus.       Right eye: No discharge.     Extraocular Movements: Extraocular movements intact.     Conjunctiva/sclera: Conjunctivae normal.     Pupils: Pupils are equal, round, and reactive to light.  Cardiovascular:     Rate and Rhythm: Normal rate and regular rhythm.     Pulses: Normal pulses.     Heart sounds: Normal heart sounds. No murmur heard. No friction rub. No gallop.   Pulmonary:     Effort: Pulmonary effort is normal. No tachypnea, accessory muscle usage or respiratory  distress.     Breath sounds: Normal air entry. Wheezing present. No decreased breath sounds, rhonchi or rales.     Comments: Minimal wheeze heard throughout all lung fields. Musculoskeletal:     Cervical back: Normal range of motion and neck supple. No rigidity or tenderness.  Lymphadenopathy:     Cervical: No cervical adenopathy.  Skin:    General: Skin is warm and dry.     Capillary Refill: Capillary refill takes less than 2 seconds.     Coloration: Skin is not pale.     Findings: No bruising, erythema, lesion or rash.  Neurological:     General: No focal deficit present.     Mental Status: He is alert and oriented to person, place, and time.      UC Treatments / Results  Labs (all labs ordered are listed, but only abnormal results are displayed) Labs Reviewed  SARS CORONAVIRUS 2 (TAT 6-24 HRS)    EKG   Radiology No results found.  Procedures Procedures (including critical care time)  Medications Ordered in UC Medications - No data to display  Initial Impression / Assessment and Plan / UC Course  I have reviewed the triage vital signs and the nursing notes.  Pertinent labs & imaging results that were available during my care of the patient were reviewed by me and considered in my medical decision making (see chart for details).  Clinical impression: 20 year old male with mild intermittent asthma without complication presents with cough and mild wheeze x4 days.  Physical exam and vitals are reassuring.  He may have a mild viral URI that is causing his symptoms today.  Treatment plan: 1.  The findings and treatment plan were discussed in detail with the patient.  Patient was in agreement. 2.  Given his symptoms and his comorbidities we will go ahead and get a COVID.  Send it out to the hospital.  Someone will contact him with his results. 3.  We will go ahead and add in an albuterol inhaler.  He will continue with his steroid inhaler as needed.  He also has Flonase  and Allegra that he uses. 4.  For his cough we gave him Tessalon Perles as well as promethazine dextromethorphan that he can use. 5.  Educational handouts provided. 6.  Plenty of rest, plenty fluids, Tylenol or Motrin for any fever or discomfort. 7.  Offered a work note he deferred on that. 8.  If his symptoms persist he should follow-up with his primary care provider.  If symptoms were to worsen then he should go to the ER.. 9.  He was discharged in stable condition.  He will follow-up here as needed.    Final Clinical Impressions(s) / UC Diagnoses   Final diagnoses:  Cough  Viral URI with cough  Mild intermittent asthma without complication  Wheeze     Discharge Instructions     See handouts.  Meds at your pharmacy    ED Prescriptions    Medication Sig Dispense Auth. Provider   albuterol (VENTOLIN HFA) 108 (90 Base) MCG/ACT inhaler Inhale 1-2 puffs into the lungs every 6 (six) hours as needed for wheezing or shortness of breath. 1 each Delton See, MD   promethazine-dextromethorphan (PROMETHAZINE-DM) 6.25-15 MG/5ML syrup Take 5 mLs by mouth 4 (four) times daily as needed for cough. 118 mL Delton See, MD   benzonatate (TESSALON) 100 MG capsule Take 1 capsule (100 mg total) by mouth every 8 (eight) hours. 21 capsule Delton See, MD     PDMP not reviewed this encounter.   Delton See, MD 05/03/21 [phone]

## 2021-05-02 NOTE — Discharge Instructions (Signed)
See handouts.  Meds at your pharmacy

## 2021-05-02 NOTE — ED Triage Notes (Signed)
Patient c/o cough, nasal congestion that started on Saturday. Denies fever.

## 2021-05-03 LAB — SARS CORONAVIRUS 2 (TAT 6-24 HRS): SARS Coronavirus 2: NEGATIVE

## 2021-07-22 IMAGING — CR DG CHEST 2V
2 series · 2 of 2 positions shown · non-contrast
Comparison: None.

CLINICAL DATA: Pt states he has been having RL lat rib pain x 3
days. Hurts to take in deep breath. Pt has small dry cough. Pt was
carrying bricks at the time pain started. Hx bronchitis, asthma

EXAM:
CHEST - 2 VIEW

[chest pa]
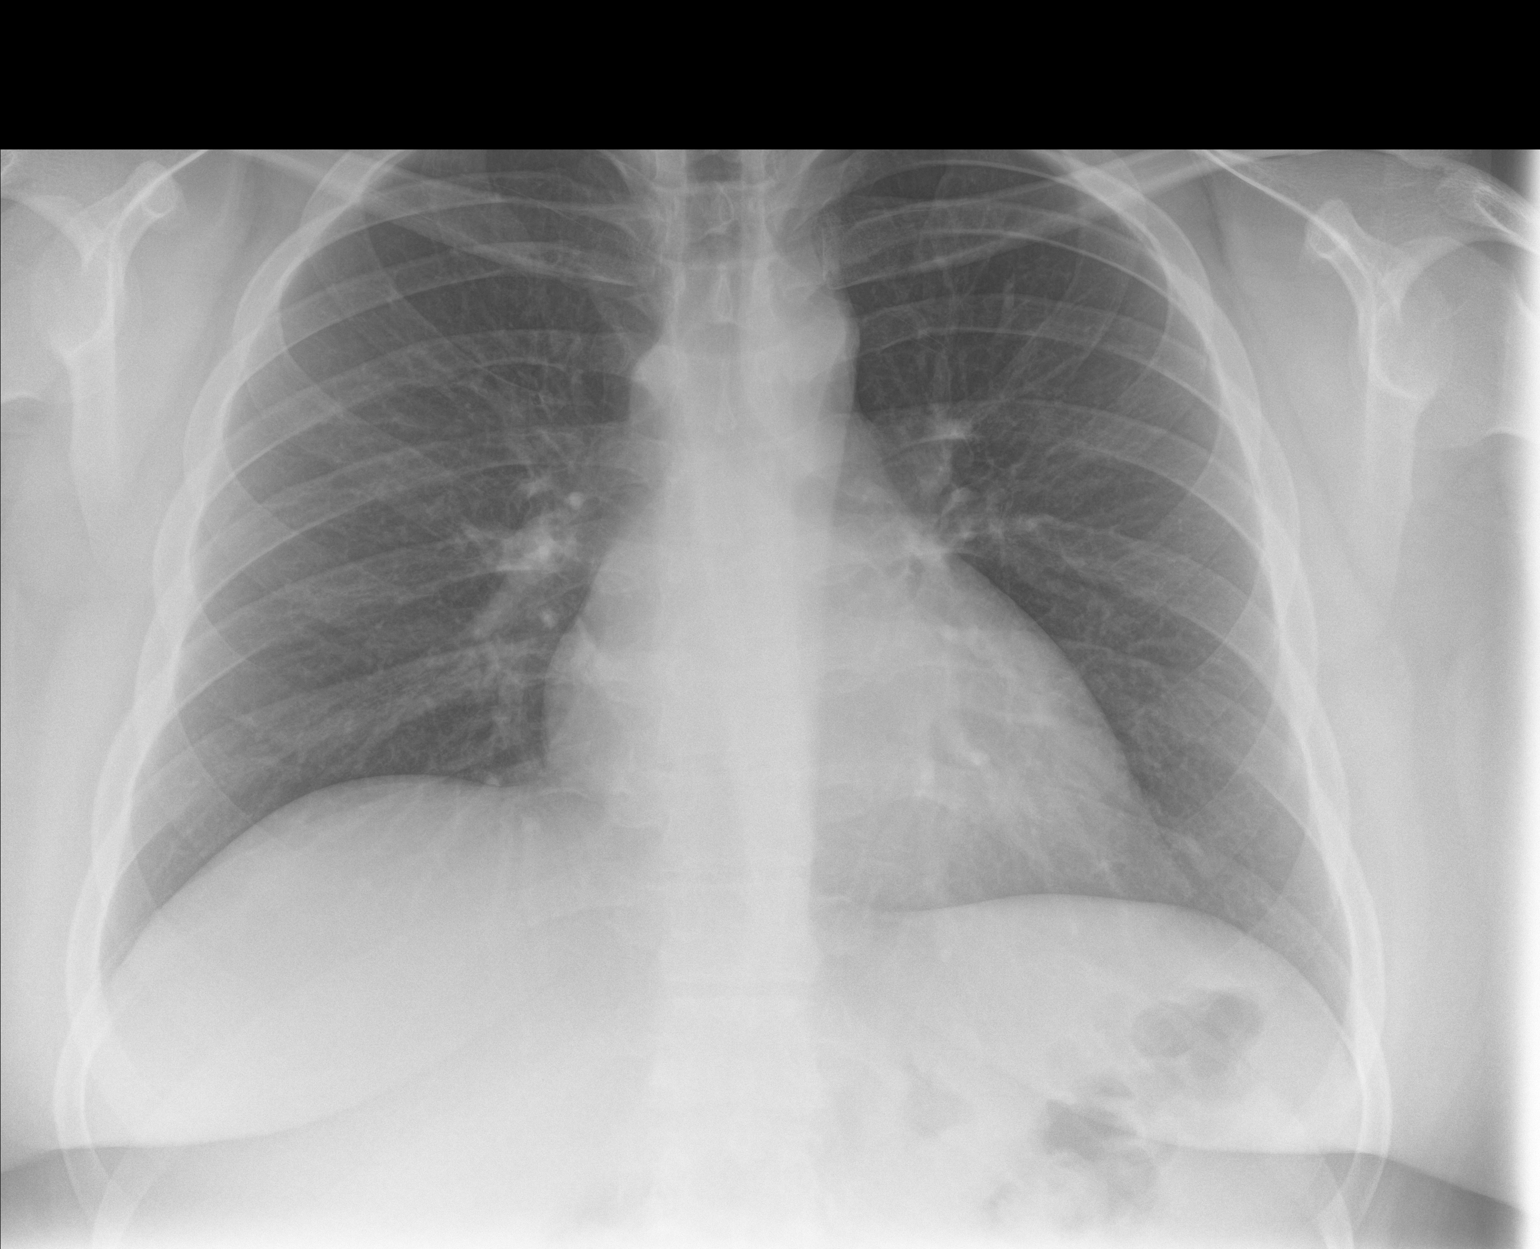

[chest lat]
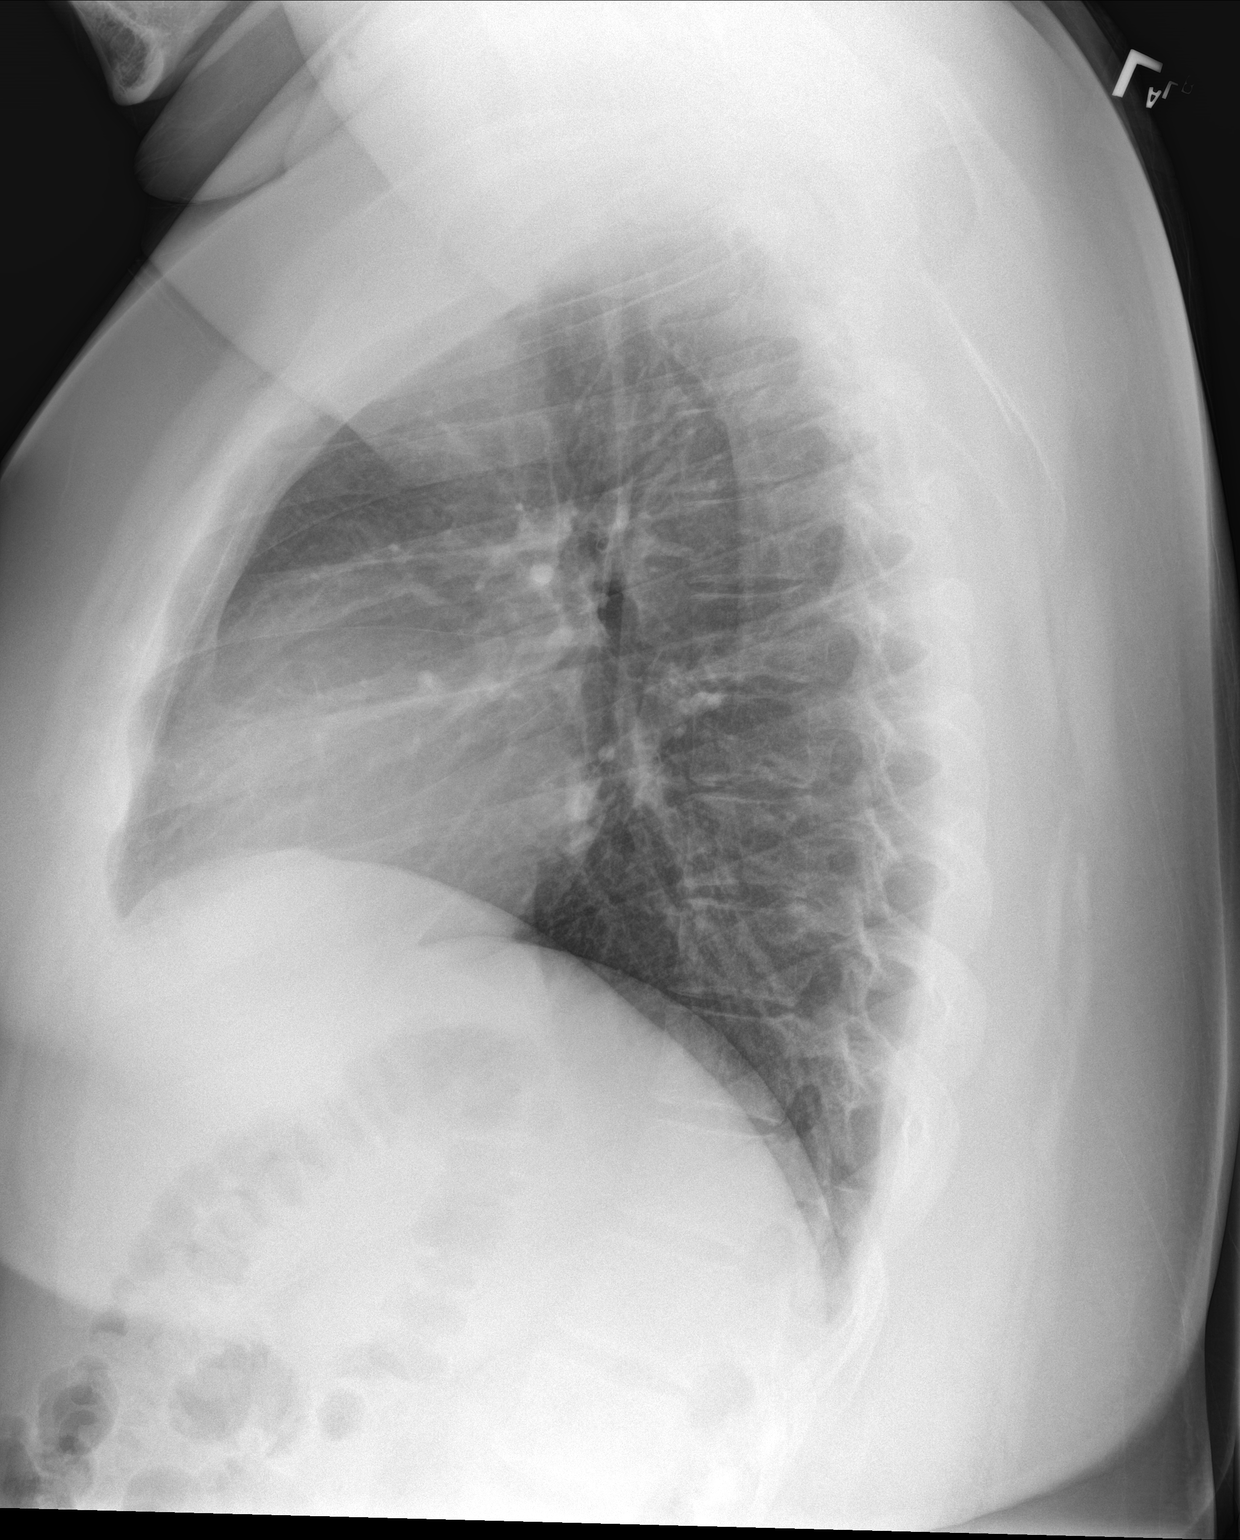

[2 of 2 positions shown; findings below may reference images not displayed]

FINDINGS: Heart size is normal. The lungs are free of focal consolidations and
pleural effusions. No pulmonary edema. No pneumothorax or evidence
for acute displaced rib fracture.
IMPRESSION: Normal chest.

## 2021-09-24 ENCOUNTER — Ambulatory Visit
Admission: EM | Admit: 2021-09-24 | Discharge: 2021-09-24 | Disposition: A | Discharge status: Home / Self Care | Payer: Medicaid Other

## 2021-09-24 ENCOUNTER — Other Ambulatory Visit: Payer: Self-pay

## 2021-09-24 DIAGNOSIS — L853 Xerosis cutis: Secondary | ICD-10-CM | POA: Diagnosis not present

## 2021-09-24 NOTE — ED Provider Notes (Signed)
[facility]    CSN: [id number] Arrival date & time: 09/24/21  1805      History   Chief Complaint Chief Complaint  Patient presents with   Hand Problem    HPI Calvin Pacheco is a 20 y.o. male.   HPI  20 year old male here for evaluation of dryness to both palms.  Patient reports that he has been experiencing dry skin on both of his palms for the past 3 weeks.  He states he did have some small bumps on the backs of both hands but those resolved.  He states that his hands do itch off and on.  When he gets his hands wet he reports that his palms peel.  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  He has tried baby lotion on occasion without improvement of his symptoms.  Past Medical History:  Diagnosis Date   Allergic rhinitis due to pollen    Asthma    Obesity     There are no problems to display for this patient.   Past Surgical History:  Procedure Laterality Date   APPENDECTOMY     HERNIA REPAIR     TONSILLECTOMY AND ADENOIDECTOMY         Home Medications    Prior to Admission medications   Medication Sig Start Date End Date Taking? Authorizing Provider  fexofenadine (ALLEGRA) 60 MG tablet Take 60 mg by mouth 2 (two) times daily.   Yes [provider]  montelukast (SINGULAIR) 10 MG tablet Take 10 mg by mouth at bedtime.   Yes [provider]  albuterol (VENTOLIN HFA) 108 (90 Base) MCG/ACT inhaler Inhale 1-2 puffs into the lungs every 6 (six) hours as needed for wheezing or shortness of breath. 05/02/21   Delton See, MD  beclomethasone (QVAR) 40 MCG/ACT inhaler Inhale 1 puff into the lungs 2 (two) times daily.    [provider]  benzonatate (TESSALON) 100 MG capsule Take 1 capsule (100 mg total) by mouth every 8 (eight) hours. 05/02/21   Delton See, MD  fluticasone [facility]) 50 MCG/ACT nasal spray Place 2 sprays into both nostrils daily. 12/06/18   Domenick Gong, MD  promethazine-dextromethorphan (PROMETHAZINE-DM) 6.25-15 MG/5ML syrup Take 5 mLs by mouth 4 (four) times daily as needed for cough. 05/02/21   Delton See, MD  SODIUM FLUORIDE 5000 PPM 1.1 % PSTE Take by mouth. 10/12/20   [provider]    Family History Family History  Problem Relation Age of Onset   Healthy Mother    Heart disease Father     Social History Social History   Tobacco Use   Smoking status: Never   Smokeless tobacco: Never  Vaping Use   Vaping Use: Never used  Substance Use Topics   Alcohol use: No    Alcohol/week: 0.0 standard drinks   Drug use: No     Allergies   Patient has no known allergies.   Review of Systems Review of Systems  Constitutional:  Negative for activity change, appetite change and fever.  Skin:        Good on the palms of both hands.  Hematological: Negative.   Psychiatric/Behavioral: Negative.      Physical Exam Triage Vital Signs ED Triage Vitals  Enc Vitals Group     BP 09/24/21 1857 (!) 108/57     Pulse Rate 09/24/21 1857 78     Resp 09/24/21 1857 19  Temp 09/24/21 1857 98.2 F (36.8 C)     Temp src --      SpO2 09/24/21 1857 100 %     Weight --      Height --      Head Circumference --      Peak Flow --      Pain Score 09/24/21 1855 0     Pain Loc --      Pain Edu? --      Excl. in GC? --    No data found.  Updated Vital Signs BP (!) 108/57   Pulse 78   Temp 98.2 F (36.8 C)   Resp 19   SpO2 100%   Visual Acuity Right Eye Distance:   Left Eye Distance:   Bilateral Distance:    Right Eye Near:   Left Eye Near:    Bilateral Near:     Physical Exam Vitals and nursing note reviewed.  Constitutional:      General: He is not in acute distress.    Appearance: Normal appearance. He is not ill-appearing.  HENT:     Head: Normocephalic and atraumatic.  Skin:    General: Skin is warm and dry.     Capillary Refill: Capillary refill takes less than 2 seconds.     Findings: No  lesion or rash.  Neurological:     General: No focal deficit present.     Mental Status: He is alert and oriented to person, place, and time.  Psychiatric:        Mood and Affect: Mood normal.        Behavior: Behavior normal.        Thought Content: Thought content normal.        Judgment: Judgment normal.     UC Treatments / Results  Labs (all labs ordered are listed, but only abnormal results are displayed) Labs Reviewed - No data to display  EKG   Radiology No results found.  Procedures Procedures (including critical care time)  Medications Ordered in UC Medications - No data to display  Initial Impression / Assessment and Plan / UC Course  I have reviewed the triage vital signs and the nursing notes.  Pertinent labs & imaging results that were available during my care of the patient were reviewed by me and considered in my medical decision making (see chart for details).  Patient is a nontoxic-appearing 20 year old male here for evaluation of dryness on both palms that been present for the past 3 weeks.  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  He does not use lotion on his hands between handwashing's but he does apply some baby lotion when he gets out of the shower at night.  He states that this has not helped his symptoms.  Patient does report intermittent itching as well.  Physical exam reveals mild dry skin to both palms without peeling, cracking, or flaking.  There is no fissures present.  No erythema.  Patient exam is consistent with dry skin secondary to handwashing.  I have advised the patient to wash his hands less, utilize a moisturizing lotion such as Eucerin or CeraVe a in between handwashing's and at night before he goes to bed to see if he has improvement of his symptoms.  If he does not have improvement of his symptoms he should follow-up with dermatology.   Final Clinical Impressions(s) / UC Diagnoses    Final diagnoses:  Dry  skin dermatitis     Discharge Instructions      Apply Eucerin or Cerave hand lotion after each hand washing and at bedtime to help return moisture to your hands.  Wash your hands less frequently so as not to strip the moisture from your skin.  If this does not improve your symptoms return for re-evaluation or see dermatology.     ED Prescriptions   None    PDMP not reviewed this encounter.   Becky Augusta, NP 09/24/21 1919

## 2021-09-24 NOTE — Discharge Instructions (Addendum)
Apply Eucerin or Cerave hand lotion after each hand washing and at bedtime to help return moisture to your hands.  Wash your hands less frequently so as not to strip the moisture from your skin.  If this does not improve your symptoms return for re-evaluation or see dermatology.

## 2021-09-24 NOTE — ED Triage Notes (Signed)
Pt presents with dryness to his palms x 3 weeks. Reports he had some small bumps on the tops of his hands that have went away.

## 2021-10-11 ENCOUNTER — Encounter: Payer: Self-pay | Admitting: Licensed Clinical Social Worker

## 2021-10-11 ENCOUNTER — Ambulatory Visit
Admission: EM | Admit: 2021-10-11 | Discharge: 2021-10-11 | Disposition: A | Discharge status: Home / Self Care | Payer: Medicaid Other | Attending: Physician Assistant | Admitting: Physician Assistant

## 2021-10-11 ENCOUNTER — Other Ambulatory Visit: Payer: Self-pay

## 2021-10-11 DIAGNOSIS — S61412A Laceration without foreign body of left hand, initial encounter: Secondary | ICD-10-CM

## 2021-10-11 MED ORDER — TETANUS-DIPHTH-ACELL PERTUSSIS 5-2.5-18.5 LF-MCG/0.5 IM SUSY
0.5000 mL | PREFILLED_SYRINGE | Freq: Once | INTRAMUSCULAR | Status: AC
  Administered 2021-10-11: 0.5 mL via INTRAMUSCULAR

## 2021-10-11 NOTE — Discharge Instructions (Addendum)
-  You have a very minor laceration.  I have repaired it with skin glue.  See handout on tissue adhesive.  This can come off if you get it wet. -Your tetanus has been updated today. -Return for any signs of infection but this should heal in the next few days.

## 2021-10-11 NOTE — ED Triage Notes (Addendum)
Left hand laceration, scraped on car door less than 30 minutes ago. Last tetanus unknown.

## 2021-10-11 NOTE — ED Provider Notes (Signed)
[facility]    CSN: [id number] Arrival date & time: 10/11/21  1958      History   Chief Complaint Chief Complaint  Patient presents with   Laceration    Left hand laceration    HPI Calvin Pacheco is a 20 y.o. male presenting for small abrasion near the left thumb.  Patient says he cut it on a car door.  He was afraid because about the blood he saw so he came immediately to urgent care.  He has not cleaned it.  Tetanus is not up-to-date.  He denies any severe pain.  No numbness.  No other complaints.  HPI  Past Medical History:  Diagnosis Date   Allergic rhinitis due to pollen    Asthma    Obesity     There are no problems to display for this patient.   Past Surgical History:  Procedure Laterality Date   APPENDECTOMY     HERNIA REPAIR     TONSILLECTOMY AND ADENOIDECTOMY         Home Medications    Prior to Admission medications   Medication Sig Start Date End Date Taking? Authorizing Provider  albuterol (VENTOLIN HFA) 108 (90 Base) MCG/ACT inhaler Inhale 1-2 puffs into the lungs every 6 (six) hours as needed for wheezing or shortness of breath. 05/02/21  Yes Delton See, MD  beclomethasone (QVAR) 40 MCG/ACT inhaler Inhale 1 puff into the lungs 2 (two) times daily.   Yes [provider]  benzonatate (TESSALON) 100 MG capsule Take 1 capsule (100 mg total) by mouth every 8 (eight) hours. 05/02/21  Yes Delton See, MD  fexofenadine (ALLEGRA) 60 MG tablet Take 60 mg by mouth 2 (two) times daily.   Yes [provider]  fluticasone (FLONASE) 50 MCG/ACT nasal spray Place 2 sprays into both nostrils daily. 12/06/18  Yes Domenick Gong, MD  montelukast (SINGULAIR) 10 MG tablet Take 10 mg by mouth at bedtime.   Yes [provider]  promethazine-dextromethorphan (PROMETHAZINE-DM) 6.25-15 MG/5ML syrup Take 5 mLs by mouth 4 (four) times daily as needed for cough. 05/02/21  Yes Delton See, MD  SODIUM FLUORIDE 5000 PPM 1.1  % PSTE Take by mouth. 10/12/20   [provider]    Family History Family History  Problem Relation Age of Onset   Healthy Mother    Heart disease Father     Social History Social History   Tobacco Use   Smoking status: Never   Smokeless tobacco: Never  Vaping Use   Vaping Use: Never used  Substance Use Topics   Alcohol use: No    Alcohol/week: 0.0 standard drinks   Drug use: No     Allergies   Patient has no known allergies.   Review of Systems Review of Systems  Musculoskeletal:  Negative for arthralgias and joint swelling.  Skin:  Positive for wound. Negative for color change.  Neurological:  Negative for weakness and numbness.    Physical Exam Triage Vital Signs ED Triage Vitals  Enc Vitals Group     BP 10/11/21 2009 (!) 146/80     Pulse Rate 10/11/21 2009 83     Resp 10/11/21 2009 16     Temp 10/11/21 2009 98.5 F (36.9 C)     Temp Source 10/11/21 2009 Oral     SpO2 10/11/21 2009 100 %     Weight 10/11/21 2003 280 lb (127 kg)     Height 10/11/21 2003 5\' 8"  (1.727 m)  Head Circumference --      Peak Flow --      Pain Score 10/11/21 2003 0     Pain Loc --      Pain Edu? --      Excl. in GC? --    No data found.  Updated Vital Signs BP (!) 146/80   Pulse 83   Temp 98.5 F (36.9 C) (Oral)   Resp 16   Ht 5\' 8"  (1.727 m)   Wt 280 lb (127 kg)   SpO2 100%   BMI 42.57 kg/m     Physical Exam Vitals and nursing note reviewed.  Constitutional:      General: He is not in acute distress.    Appearance: Normal appearance. He is well-developed. He is obese. He is not ill-appearing.  HENT:     Head: Normocephalic and atraumatic.  Eyes:     General: No scleral icterus.    Conjunctiva/sclera: Conjunctivae normal.  Cardiovascular:     Rate and Rhythm: Normal rate and regular rhythm.     Pulses: Normal pulses.     Heart sounds: Normal heart sounds.  Pulmonary:     Effort: Pulmonary effort is normal. No respiratory distress.   Musculoskeletal:     Cervical back: Neck supple.  Skin:    General: Skin is warm and dry.     Comments: 5 mm superficial laceration left palm adjacent to thumb.  Neurological:     General: No focal deficit present.     Mental Status: He is alert. Mental status is at baseline.     Motor: No weakness.     Coordination: Coordination normal.     Gait: Gait normal.  Psychiatric:        Mood and Affect: Mood normal.        Behavior: Behavior normal.        Thought Content: Thought content normal.     UC Treatments / Results  Labs (all labs ordered are listed, but only abnormal results are displayed) Labs Reviewed - No data to display  EKG   Radiology No results found.  Procedures Procedures (including critical care time)  LACERATION REPAIR: Small laceration on his superficial left palm.  Area cleaned with chlorhexidine and saline.  Patient gave permission for repair.  Closed wound with Dermabond.  Patient tolerated well.  Medications Ordered in UC Medications  Tdap (BOOSTRIX) injection 0.5 mL (0.5 mLs Intramuscular Given 10/11/21 2016)    Initial Impression / Assessment and Plan / UC Course  I have reviewed the triage vital signs and the nursing notes.  Pertinent labs & imaging results that were available during my care of the patient were reviewed by me and considered in my medical decision making (see chart for details).  20 year old male presenting for superficial laceration of the left palm.  This occurred about 30 minutes ago.  Wound is very minor.  Areas been cleaned with chlorhexidine and saline and repaired with Dermabond.  Tetanus updated. Patient tolerated well.  Reviewed wound care and return ED precautions.   Final Clinical Impressions(s) / UC Diagnoses   Final diagnoses:  Laceration of left hand without foreign body, initial encounter     Discharge Instructions      -You have a very minor laceration.  I have repaired it with skin glue.  See handout on  tissue adhesive.  This can come off if you get it wet. -Your tetanus has been updated today. -Return for any signs of infection but this  should heal in the next few days.     ED Prescriptions   None    PDMP not reviewed this encounter.   Shirlee Latch, PA-C 10/11/21 2027

## 2021-11-14 ENCOUNTER — Other Ambulatory Visit: Payer: Self-pay

## 2021-11-14 DIAGNOSIS — J029 Acute pharyngitis, unspecified: Secondary | ICD-10-CM | POA: Insufficient documentation

## 2021-11-14 DIAGNOSIS — R519 Headache, unspecified: Secondary | ICD-10-CM | POA: Insufficient documentation

## 2021-11-14 DIAGNOSIS — Z5321 Procedure and treatment not carried out due to patient leaving prior to being seen by health care provider: Secondary | ICD-10-CM | POA: Insufficient documentation

## 2021-11-14 DIAGNOSIS — Z20822 Contact with and (suspected) exposure to covid-19: Secondary | ICD-10-CM | POA: Diagnosis not present

## 2021-11-14 LAB — RESP PANEL BY RT-PCR (FLU A&B, COVID) ARPGX2
Influenza A by PCR: NEGATIVE
Influenza B by PCR: NEGATIVE
SARS Coronavirus 2 by RT PCR: NEGATIVE

## 2021-11-14 LAB — GROUP A STREP BY PCR: Group A Strep by PCR: NOT DETECTED

## 2021-11-14 NOTE — ED Triage Notes (Signed)
Pt arrived via POV with reports of throat pain that started this morning, pt states he cut the foreskin of his penis during intercourse. Pt denies any piercings. Pt states he is uncircumcised.   PT also c/o HA.

## 2021-11-15 ENCOUNTER — Emergency Department
Admission: EM | Admit: 2021-11-15 | Discharge: 2021-11-15 | Disposition: A | Discharge status: Home / Self Care | Payer: BC Managed Care – PPO | Attending: Emergency Medicine | Admitting: Emergency Medicine

## 2025-01-01 ENCOUNTER — Encounter: Payer: Self-pay | Admitting: Emergency Medicine

## 2025-01-01 ENCOUNTER — Ambulatory Visit: Payer: Self-pay

## 2025-01-01 ENCOUNTER — Ambulatory Visit: Payer: Self-pay | Admitting: Family Medicine

## 2025-01-01 ENCOUNTER — Ambulatory Visit: Admission: EM | Admit: 2025-01-01 | Discharge: 2025-01-01 | Discharge status: Absent without leave | Payer: Self-pay

## 2025-01-01 ENCOUNTER — Ambulatory Visit
Admission: EM | Admit: 2025-01-01 | Discharge: 2025-01-01 | Disposition: A | Discharge status: Home / Self Care | Payer: Self-pay | Attending: Family Medicine | Admitting: Family Medicine

## 2025-01-01 DIAGNOSIS — R0602 Shortness of breath: Secondary | ICD-10-CM

## 2025-01-01 DIAGNOSIS — J22 Unspecified acute lower respiratory infection: Secondary | ICD-10-CM

## 2025-01-01 DIAGNOSIS — J4521 Mild intermittent asthma with (acute) exacerbation: Secondary | ICD-10-CM

## 2025-01-01 MED ORDER — PROMETHAZINE-DM 6.25-15 MG/5ML PO SYRP: 5.0000 mL | ORAL_SOLUTION | Freq: Four times a day (QID) | ORAL | 0 refills | Status: AC | PRN

## 2025-01-01 MED ORDER — ALBUTEROL SULFATE HFA 108 (90 BASE) MCG/ACT IN AERS: 1.0000 | INHALATION_SPRAY | Freq: Four times a day (QID) | RESPIRATORY_TRACT | 0 refills | Status: AC | PRN

## 2025-01-01 MED ORDER — BENZONATATE 100 MG PO CAPS: 100.0000 mg | ORAL_CAPSULE | Freq: Three times a day (TID) | ORAL | 0 refills | Status: AC

## 2025-01-01 MED ORDER — PREDNISONE 50 MG PO TABS: 50.0000 mg | ORAL_TABLET | Freq: Every day | ORAL | 0 refills | Status: AC

## 2025-01-01 NOTE — ED Triage Notes (Signed)
 Pt c/o cough, chest congestion, nasal congestion, runny nose. Started about a week ago. Denies fever.

## 2025-01-01 NOTE — ED Provider Notes (Addendum)
 " [facility]    CSN: [id number] Arrival date & time: 01/01/25  1306      History   Chief Complaint Chief Complaint  Patient presents with   Cough    HPI Calvin Pacheco is a 24 y.o. male.   HPI  History obtained from the patient. Calvin Pacheco presents for burning sensation with coughing that started 7 days ago.  He has been using Mucinex and drinking tea with honey.  Has sore throat, nasal congestion, rhinorrhea and left ear pain. Denies  fever, vomiting and diarrhea.   He has asthma. Last used he used his inhaler as he felt short of breath.  He denies smoking or vaping.        Past Medical History:  Diagnosis Date   Allergic rhinitis due to pollen    Asthma    Obesity     There are no active problems to display for this patient.   Past Surgical History:  Procedure Laterality Date   APPENDECTOMY     HERNIA REPAIR     TONSILLECTOMY AND ADENOIDECTOMY         Home Medications    Prior to Admission medications  Medication Sig Start Date End Date Taking? Authorizing Provider  predniSONE  (DELTASONE ) 50 MG tablet Take 1 tablet (50 mg total) by mouth daily for 5 days. 01/01/25 01/06/25 Yes Madelena Maturin, DO  albuterol  (VENTOLIN  HFA) 108 (90 Base) MCG/ACT inhaler Inhale 1-2 puffs into the lungs every 6 (six) hours as needed for wheezing or shortness of breath. 01/01/25   Fantashia Shupert, DO  beclomethasone (QVAR) 40 MCG/ACT inhaler Inhale 1 puff into the lungs 2 (two) times daily.    [provider]  benzonatate  (TESSALON ) 100 MG capsule Take 1 capsule (100 mg total) by mouth every 8 (eight) hours. 01/01/25   Rayette Mogg, DO  fexofenadine  (ALLEGRA ) 60 MG tablet Take 60 mg by mouth 2 (two) times daily.    [provider]  fluticasone  (FLONASE ) 50 MCG/ACT nasal spray Place 2 sprays into both nostrils daily. 12/06/18   Van Knee, MD  montelukast (SINGULAIR) 10 MG tablet Take 10 mg by mouth at bedtime.    [provider]   promethazine -dextromethorphan (PROMETHAZINE -DM) 6.25-15 MG/5ML syrup Take 5 mLs by mouth 4 (four) times daily as needed for cough. 01/01/25   Cosima Prentiss, DO  SODIUM FLUORIDE 5000 PPM 1.1 % PSTE Take by mouth. 10/12/20   [provider]    Family History Family History  Problem Relation Age of Onset   Healthy Mother    Heart disease Father     Social History Social History[1]   Allergies   Patient has no known allergies.   Review of Systems Review of Systems: negative unless otherwise stated in HPI.      Physical Exam Triage Vital Signs ED Triage Vitals [01/01/25 1329]  Encounter Vitals Group     BP      Girls Systolic BP Percentile      Girls Diastolic BP Percentile      Boys Systolic BP Percentile      Boys Diastolic BP Percentile      Pulse      Resp      Temp      Temp src      SpO2      Weight 289 lb 0.4 oz (131.1 kg)     Height 5' 8 (1.727 m)     Head Circumference      Peak Flow  Pain Score 0     Pain Loc      Pain Education      Exclude from Growth Chart    No data found.  Updated Vital Signs BP 113/77 (BP Location: Left Arm)   Pulse 97   Temp 98.9 F (37.2 C) (Oral)   Resp 16   Ht 5' 8 (1.727 m)   Wt 131.1 kg   SpO2 95%   BMI 43.95 kg/m   Visual Acuity Right Eye Distance:   Left Eye Distance:   Bilateral Distance:    Right Eye Near:   Left Eye Near:    Bilateral Near:     Physical Exam GEN:     alert, non-toxic appearing male in no distress    HENT:  mucus membranes moist, oropharyngeal without lesions or erythema, no tonsillar hypertrophy or exudates, no nasal discharge, bilateral TM normal, mild left posterior external auditory canal erythema without discharge EYES:   no scleral injection or discharge NECK:  normal ROM, no lymphadenopathy, no meningismus   RESP:  no increased work of breathing, coarse breath sounds bilaterally CVS:   regular rate and rhythm Skin:   warm and dry, no rash on visible  skin    UC Treatments / Results  Labs (all labs ordered are listed, but only abnormal results are displayed) Labs Reviewed - No data to display  EKG   Radiology DG Chest 2 View Result Date: 01/01/2025 CLINICAL DATA:  Cough and shortness of breath for 1 week. EXAM: DG CHEST 2V COMPARISON:  10/28/2019 FINDINGS: The heart size and mediastinal contours are within normal limits. Both lungs are clear. The visualized skeletal structures are unremarkable. IMPRESSION: Normal exam. Electronically Signed   By: Norleen DELENA Kil M.D.   On: 01/01/2025 14:18     Procedures Procedures (including critical care time)  Medications Ordered in UC Medications - No data to display  Initial Impression / Assessment and Plan / UC Course  I have reviewed the triage vital signs and the nursing notes.  Pertinent labs & imaging results that were available during my care of the patient were reviewed by me and considered in my medical decision making (see chart for details).      Pt is a 24 y.o. male who presents for 1 week cough with some intermittent shortness of breath while in the freeze at work that is not improving.  Calvin Pacheco is  afebrile here without recent antipyretics. Satting adequately on room air. Overall pt is  non-toxic appearing, well hydrated, without respiratory distress. Pulmonary exam is remarkable for bilateral coarse breath sounds with cough.  Patient requesting chest x-ray which was attained.  COVID  and influenza testing deferred due to length of symptoms.   Chest xray personally reviewed by me without focal pneumonia, pleural effusion, cardiomegaly or pneumothorax. Patient aware the radiologist has not read his xray and is comfortable with the preliminary read by me. Will review radiologist read when available and call patient if a change in plan is warranted.  Pt agreeable to this plan prior to discharge.  Treat acute asthma flare up with steroids and albuterol  inhaler as below.  Tessalon   Perles and Promethazine  DM cough syrup given for cough and allow patient to rest.  Typical duration of symptoms discussed. Return and ED precautions given and patient voiced understanding.  Work note provided per patient request  Discussed MDM, treatment plan and plan for follow-up with patient who agrees with plan.     Radiologist impression reviewed.  Final Clinical Impressions(s) / UC Diagnoses   Final diagnoses:  SOB (shortness of breath)  Lower respiratory tract infection  Mild intermittent asthma with acute exacerbation     Discharge Instructions      Your chest xray did not show evidence of pneumonia though the radiologist has not yet read it. If they find something that I didn't, I will call you.    Stop by the pharmacy to pick up your prescriptions.  Follow up with your primary care provider or return to the urgent care, if not improving.        ED Prescriptions     Medication Sig Dispense Auth. Provider   benzonatate  (TESSALON ) 100 MG capsule Take 1 capsule (100 mg total) by mouth every 8 (eight) hours. 21 capsule Dimonique Bourdeau, DO   promethazine -dextromethorphan (PROMETHAZINE -DM) 6.25-15 MG/5ML syrup Take 5 mLs by mouth 4 (four) times daily as needed for cough. 118 mL Georgie Haque, DO   predniSONE  (DELTASONE ) 50 MG tablet Take 1 tablet (50 mg total) by mouth daily for 5 days. 5 tablet Ellawyn Wogan, DO   albuterol  (VENTOLIN  HFA) 108 (90 Base) MCG/ACT inhaler Inhale 1-2 puffs into the lungs every 6 (six) hours as needed for wheezing or shortness of breath. 1 each Lamichael Youkhana, DO      PDMP not reviewed this encounter.    Kriste Berth, DO 01/01/25 1437     [1]  Social History Tobacco Use   Smoking status: Never   Smokeless tobacco: Never  Vaping Use   Vaping status: Never Used  Substance Use Topics   Alcohol use: No    Alcohol/week: 0.0 standard drinks of alcohol   Drug use: No     Kriste Berth, DO 01/01/25 1438  "

## 2025-01-01 NOTE — Discharge Instructions (Addendum)
 Your chest xray did not show evidence of pneumonia  though the radiologist has not yet read it. If they find something that I didn't, I will call you.    Stop by the pharmacy to pick up your prescriptions.  Follow up with your primary care provider or return to the urgent care, if not improving.
